# Patient Record
Sex: Female | Born: 1985 | Hispanic: Yes | Marital: Married | Smoking: Never smoker
Health system: Southern US, Community
[De-identification: ages and names within clinical notes are randomized; demographics above are authoritative.]

## PROBLEM LIST (undated history)

## (undated) DIAGNOSIS — I10 Essential (primary) hypertension: Secondary | ICD-10-CM

## (undated) DIAGNOSIS — E119 Type 2 diabetes mellitus without complications: Secondary | ICD-10-CM

## (undated) DIAGNOSIS — K219 Gastro-esophageal reflux disease without esophagitis: Secondary | ICD-10-CM

## (undated) HISTORY — DX: Gastro-esophageal reflux disease without esophagitis: K21.9

## (undated) HISTORY — DX: Essential (primary) hypertension: I10

## (undated) HISTORY — DX: Type 2 diabetes mellitus without complications: E11.9

---

## 2020-03-29 DIAGNOSIS — E669 Obesity, unspecified: Secondary | ICD-10-CM | POA: Insufficient documentation

## 2020-03-29 DIAGNOSIS — E66811 Obesity, class 1: Secondary | ICD-10-CM | POA: Insufficient documentation

## 2020-03-29 DIAGNOSIS — E282 Polycystic ovarian syndrome: Secondary | ICD-10-CM | POA: Insufficient documentation

## 2020-03-29 DIAGNOSIS — Z87410 Personal history of cervical dysplasia: Secondary | ICD-10-CM | POA: Insufficient documentation

## 2020-03-29 DIAGNOSIS — I10 Essential (primary) hypertension: Secondary | ICD-10-CM | POA: Insufficient documentation

## 2020-03-30 DIAGNOSIS — G5602 Carpal tunnel syndrome, left upper limb: Secondary | ICD-10-CM | POA: Insufficient documentation

## 2020-08-15 ENCOUNTER — Other Ambulatory Visit: Payer: Self-pay

## 2020-08-15 ENCOUNTER — Ambulatory Visit
Admission: EM | Admit: 2020-08-15 | Discharge: 2020-08-15 | Disposition: A | Discharge status: Home / Self Care | Payer: 59

## 2020-08-16 LAB — HEPATIC FUNCTION PANEL
ALT: 85 — AB (ref 7–35)
AST: 42 — AB (ref 13–35)
Alkaline Phosphatase: 90 (ref 25–125)
Bilirubin, Total: 0.3

## 2020-08-16 LAB — BASIC METABOLIC PANEL
BUN: 11 (ref 4–21)
CO2: 22 (ref 13–22)
Chloride: 104 (ref 99–108)
Creatinine: 0.7 (ref 0.5–1.1)
Potassium: 4.1 (ref 3.4–5.3)
Sodium: 137 (ref 137–147)

## 2020-10-29 ENCOUNTER — Ambulatory Visit: Payer: 59 | Admitting: Internal Medicine

## 2020-11-15 ENCOUNTER — Telehealth: Payer: Self-pay

## 2020-11-15 ENCOUNTER — Encounter: Payer: Self-pay | Admitting: Internal Medicine

## 2020-11-15 NOTE — Telephone Encounter (Signed)
Copied from CRM [id number]. Topic: General - Other >> Nov 15, 2020 11:06 AM Darron Doom wrote: Reason for CRM: Patient called in to inquire of Dr Judithann Graves that she is out of her BP medication ( Labetalol 400 MG BID ) and need it refilled. Say that since her appointment was cancelled from original she is needing help. States that her former PCP will no longer fill this medication Please advise  Ph# [phone]

## 2020-11-15 NOTE — Telephone Encounter (Signed)
Will route result note to [facility] Nurse Triage for follow up when patient returns call to clinic. Nurse may give results to patient if they return call. CRM created for this message.

## 2020-11-15 NOTE — Telephone Encounter (Signed)
Called pt left VM to call back. We can not send in any medication for pt before she establishes care. Called pharmacy [facility] in [place] they stated that pt picked up lasted refill 10/26/2020 and that she has a refill. Pt has a appt on 11/23/20. Pt should get refill from [facility].  KP

## 2020-11-15 NOTE — Telephone Encounter (Signed)
Spoke to pt let her know that she would need to go to [facility] to get her medication and if she is supposed to be taking 400 MG to take 400 MG and she should have enough for her next appt. Let pt know we can not send in any medication before she has established care. Pt verbalized understanding.  KP

## 2020-11-15 NOTE — Telephone Encounter (Signed)
Per initial encounter, "Called pt left VM to call back. We can not send in any medication for pt before she establishes care. Called pharmacy [facility] in [place] they stated that pt picked up lasted refill 10/26/2020 and that she has a refill. Pt has a appt on 11/23/20. Pt should get refill from [facility]. KP"; the pt verbalized understanding and says she is taking 400 mg of labetalol instead of 200 mg because she has post partum pre-eclampsia; her 10/29/20 appt with Dr  Judithann Graves was cancelled because the provider was going to be out of the office; she says [facility] told her since more than 12 weeks ago (08/08/20) they are not able to give her any medication; the pt can be contacted at [phone]; she is re-scheduled to see Dr Judithann Graves 11/23/20 at 1520; will route to office for final disposition.

## 2020-11-23 ENCOUNTER — Ambulatory Visit: Payer: 59 | Admitting: Internal Medicine

## 2020-11-23 ENCOUNTER — Other Ambulatory Visit: Payer: Self-pay

## 2020-11-23 ENCOUNTER — Encounter: Payer: Self-pay | Admitting: Internal Medicine

## 2020-11-23 VITALS — BP 128/84 | HR 88 | Ht 65.0 in | Wt 193.0 lb

## 2020-11-23 DIAGNOSIS — E118 Type 2 diabetes mellitus with unspecified complications: Secondary | ICD-10-CM | POA: Insufficient documentation

## 2020-11-23 DIAGNOSIS — I10 Essential (primary) hypertension: Secondary | ICD-10-CM | POA: Diagnosis not present

## 2020-11-23 DIAGNOSIS — R0981 Nasal congestion: Secondary | ICD-10-CM

## 2020-11-23 MED ORDER — BENAZEPRIL HCL 40 MG PO TABS: 40.0000 mg | ORAL_TABLET | Freq: Every day | ORAL | 1 refills | Status: DC

## 2020-11-23 MED ORDER — METFORMIN HCL 1000 MG PO TABS: 1000.0000 mg | ORAL_TABLET | Freq: Two times a day (BID) | ORAL | 1 refills | Status: DC

## 2020-11-23 NOTE — Progress Notes (Signed)
Date:  11/23/2020   Name:  Sarah Proctor   DOB:  11/02/1985   MRN:  [id number]   Chief Complaint: Establish Care (Was getting meds from [facility] )  Hypertension This is a chronic problem. Episode onset: age 35. The problem is controlled. Pertinent negatives include no chest pain, headaches, palpitations or shortness of breath. Past treatments include beta blockers and ACE inhibitors (on benazepril 40 mg before her pregnancy.  now taking labetalol 300 mg bid). The current treatment provides moderate improvement. There are no compliance problems (although diet is not the most healthy).  There is no history of kidney disease, CAD/MI or CVA.  Diabetes She presents for her follow-up diabetic visit. She has type 2 diabetes mellitus. The initial diagnosis of diabetes was made 5 years ago. Her disease course has been stable. Pertinent negatives for hypoglycemia include no headaches or nervousness/anxiousness. Pertinent negatives for diabetes include no chest pain, no fatigue, no polydipsia and no polyuria. Symptoms are stable. Pertinent negatives for diabetic complications include no CVA. She is following a generally healthy diet. She monitors blood glucose at home 1-2 x per day. Her breakfast blood glucose is taken between 7-8 am. Her breakfast blood glucose range is generally 110-130 mg/dl. Her bedtime blood glucose is taken between 8-9 pm. Her bedtime blood glucose range is generally 140-180 mg/dl. An ACE inhibitor/angiotensin II receptor blocker is not being taken (due to recent pregnancy).  Sinus Problem This is a chronic problem. The problem is unchanged. There has been no fever. She is experiencing no pain. Associated symptoms include congestion. Pertinent negatives include no coughing, headaches or shortness of breath. (Severe, using Afrin spray to be able to breathe)    Lab Results  Component Value Date   CREATININE 0.7 08/16/2020   BUN 11 08/16/2020   NA 137 08/16/2020   K 4.1  08/16/2020   CL 104 08/16/2020   CO2 22 08/16/2020   No results found for: CHOL, HDL, LDLCALC, LDLDIRECT, TRIG, CHOLHDL No results found for: TSH No results found for: HGBA1C No results found for: WBC, HGB, HCT, MCV, PLT Lab Results  Component Value Date   ALT 85 (A) 08/16/2020   AST 42 (A) 08/16/2020   ALKPHOS 90 08/16/2020     Review of Systems  Constitutional: Negative for appetite change, fatigue, fever and unexpected weight change.  HENT: Positive for congestion.   Eyes: Negative for visual disturbance.  Respiratory: Negative for cough, chest tightness and shortness of breath.   Cardiovascular: Negative for chest pain, palpitations and leg swelling.  Gastrointestinal: Negative for abdominal pain.  Endocrine: Negative for polydipsia and polyuria.  Genitourinary: Negative for dysuria and hematuria.  Musculoskeletal: Negative for arthralgias.  Neurological: Negative for headaches.  Psychiatric/Behavioral: Negative for dysphoric mood. The patient is not nervous/anxious.     Patient Active Problem List   Diagnosis Date Noted  . Type II diabetes mellitus with complication (HCC) 11/23/2020  . Chronic nasal congestion 11/23/2020  . Left carpal tunnel syndrome 03/30/2020  . PCOS (polycystic ovarian syndrome) 03/29/2020  . Obesity (BMI 30.0-34.9) 03/29/2020  . History of cervical dysplasia 03/29/2020  . Essential hypertension 03/29/2020    Not on File  History reviewed. No pertinent surgical history.  Social History   Tobacco Use  . Smoking status: Never Smoker  . Smokeless tobacco: Never Used  Vaping Use  . Vaping Use: Never used  Substance Use Topics  . Alcohol use: Not Currently  . Drug use: Never     Medication  list has been reviewed and updated.  Current Meds  Medication Sig  . BD INSULIN SYRINGE U/F 31G X 5/16" 1 ML MISC See admin instructions.  . benazepril (LOTENSIN) 40 MG tablet Take 1 tablet (40 mg total) by mouth daily.  Marland Kitchen glucose blood ([employer]  BLOOD GLUCOSE TEST) test strip 1 each (1 strip total) by XX route 4 (four) times daily Use as instructed.  . [DISCONTINUED] labetalol (NORMODYNE) 200 MG tablet Take 300 mg by mouth 2 (two) times daily.  . [DISCONTINUED] metFORMIN (GLUCOPHAGE) 1000 MG tablet Take 1,000 mg by mouth 2 (two) times daily.    PHQ 2/9 Scores 11/23/2020  PHQ - 2 Score 0  PHQ- 9 Score 1    GAD 7 : Generalized Anxiety Score 11/23/2020  Nervous, Anxious, on Edge 1  Control/stop worrying 1  Worry too much - different things 1  Trouble relaxing 1  Restless 1  Easily annoyed or irritable 1  Afraid - awful might happen 1  Total GAD 7 Score 7    BP Readings from Last 3 Encounters:  11/23/20 128/84    Physical Exam Vitals and nursing note reviewed.  Constitutional:      General: She is not in acute distress.    Appearance: Normal appearance. She is well-developed.  HENT:     Head: Normocephalic and atraumatic.     Nose:     Right Turbinates: Enlarged, swollen and pale.     Left Turbinates: Enlarged, swollen and pale.     Right Sinus: No maxillary sinus tenderness or frontal sinus tenderness.     Left Sinus: No maxillary sinus tenderness or frontal sinus tenderness.  Neck:     Vascular: No carotid bruit.  Cardiovascular:     Rate and Rhythm: Normal rate and regular rhythm.     Pulses: Normal pulses.     Heart sounds: No murmur heard.   Pulmonary:     Effort: Pulmonary effort is normal. No respiratory distress.     Breath sounds: No wheezing or rhonchi.  Musculoskeletal:        General: Normal range of motion.     Cervical back: Normal range of motion.     Right lower leg: No edema.     Left lower leg: No edema.  Lymphadenopathy:     Cervical: No cervical adenopathy.  Skin:    General: Skin is warm and dry.     Findings: No rash.  Neurological:     General: No focal deficit present.     Mental Status: She is alert and oriented to person, place, and time.  Psychiatric:        Mood and Affect:  Mood and affect and mood normal.        Behavior: Behavior normal.     Wt Readings from Last 3 Encounters:  11/23/20 193 lb (87.5 kg)    BP 128/84   Pulse 88   Ht 5\' 5"  (1.651 m)   Wt 193 lb (87.5 kg)   LMP 10/29/2020   SpO2 98%   BMI 32.12 kg/m   Assessment and Plan: 1. Essential hypertension BP is fairly well controlled on high dose labetaolol Did well in the past on Benazepril so will resume Continue working on diet changes; monitor BP at home and follow up in 3 months, sooner if problems. - benazepril (LOTENSIN) 40 MG tablet; Take 1 tablet (40 mg total) by mouth daily.  Dispense: 90 tablet; Refill: 1 - CBC with Differential/Platelet - TSH  2.  Type II diabetes mellitus with complication (HCC) Clinically stable by exam and report without s/s of hypoglycemia. DM complicated by HTN. Tolerating medications - metformin 1000 mg bid - well without side effects or other concerns. - metFORMIN (GLUCOPHAGE) 1000 MG tablet; Take 1 tablet (1,000 mg total) by mouth 2 (two) times daily.  Dispense: 180 tablet; Refill: 1 - Comprehensive metabolic panel - Hemoglobin A1c  3. Chronic nasal congestion Can use Afrin PRN but avoid daily use Will refer to ENT for further evaluation - Ambulatory referral to ENT   Partially dictated using Dragon software. Any errors are unintentional.  Bari Edward, MD [facility]  11/23/2020

## 2020-11-24 LAB — CBC WITH DIFFERENTIAL/PLATELET
Basophils Absolute: 0.1 10*3/uL (ref 0.0–0.2)
Basos: 1 %
EOS (ABSOLUTE): 0.6 10*3/uL — ABNORMAL HIGH (ref 0.0–0.4)
Eos: 6 %
Hematocrit: 40.8 % (ref 34.0–46.6)
Hemoglobin: 13.3 g/dL (ref 11.1–15.9)
Immature Grans (Abs): 0.1 10*3/uL (ref 0.0–0.1)
Immature Granulocytes: 1 %
Lymphocytes Absolute: 2 10*3/uL (ref 0.7–3.1)
Lymphs: 20 %
MCH: 27.8 pg (ref 26.6–33.0)
MCHC: 32.6 g/dL (ref 31.5–35.7)
MCV: 85 fL (ref 79–97)
Monocytes Absolute: 0.7 10*3/uL (ref 0.1–0.9)
Monocytes: 7 %
Neutrophils Absolute: 6.5 10*3/uL (ref 1.4–7.0)
Neutrophils: 65 %
Platelets: 227 10*3/uL (ref 150–450)
RBC: 4.78 x10E6/uL (ref 3.77–5.28)
RDW: 13.7 % (ref 11.7–15.4)
WBC: 9.9 10*3/uL (ref 3.4–10.8)

## 2020-11-24 LAB — COMPREHENSIVE METABOLIC PANEL
ALT: 38 IU/L — ABNORMAL HIGH (ref 0–32)
AST: 19 IU/L (ref 0–40)
Albumin/Globulin Ratio: 1.8 (ref 1.2–2.2)
Albumin: 4.6 g/dL (ref 3.8–4.8)
Alkaline Phosphatase: 77 IU/L (ref 44–121)
BUN/Creatinine Ratio: 19 (ref 9–23)
BUN: 13 mg/dL (ref 6–20)
Bilirubin Total: 0.2 mg/dL (ref 0.0–1.2)
CO2: 20 mmol/L (ref 20–29)
Calcium: 9.6 mg/dL (ref 8.7–10.2)
Chloride: 101 mmol/L (ref 96–106)
Creatinine, Ser: 0.68 mg/dL (ref 0.57–1.00)
GFR calc Af Amer: 131 mL/min/{1.73_m2} (ref >59)
GFR calc non Af Amer: 114 mL/min/{1.73_m2} (ref >59)
Globulin, Total: 2.5 g/dL (ref 1.5–4.5)
Glucose: 108 mg/dL — ABNORMAL HIGH (ref 65–99)
Potassium: 4.2 mmol/L (ref 3.5–5.2)
Sodium: 138 mmol/L (ref 134–144)
Total Protein: 7.1 g/dL (ref 6.0–8.5)

## 2020-11-24 LAB — HEMOGLOBIN A1C
Est. average glucose Bld gHb Est-mCnc: 143 mg/dL
Hgb A1c MFr Bld: 6.6 % — ABNORMAL HIGH (ref 4.8–5.6)

## 2020-11-24 LAB — TSH: TSH: 1.42 u[IU]/mL (ref 0.450–4.500)

## 2020-12-07 ENCOUNTER — Telehealth: Payer: Self-pay | Admitting: Internal Medicine

## 2020-12-07 ENCOUNTER — Other Ambulatory Visit: Payer: Self-pay | Admitting: Internal Medicine

## 2020-12-07 DIAGNOSIS — I10 Essential (primary) hypertension: Secondary | ICD-10-CM

## 2020-12-07 MED ORDER — LABETALOL HCL 200 MG PO TABS: 300.0000 mg | ORAL_TABLET | Freq: Two times a day (BID) | ORAL | 2 refills | Status: DC

## 2020-12-07 NOTE — Telephone Encounter (Signed)
Please review pt had a new patient appt on 11/23/2020.  KP

## 2020-12-07 NOTE — Telephone Encounter (Signed)
Labetolol sent in.  She will need to follow up closely with her OB for HTN.

## 2020-12-07 NOTE — Telephone Encounter (Signed)
Pt called stating that she recently found out that she was pregnant and that she is needing to have a new BP medication. Please advise.     [id number] [place], [place] - [address] AT [facility] OF [address]  [address] [place] [address]  Phone: [phone] Fax: [phone]  Hours: Not open 24 hours

## 2020-12-07 NOTE — Telephone Encounter (Signed)
Spoke to pt let her know that labetolol was sent to pharmacy. Let pt know that she needed to follow up closely with OB for her HTN. Pt verbalized understanding.  KP

## 2021-01-18 LAB — HM PAP SMEAR: HM Pap smear: NEGATIVE

## 2021-02-21 ENCOUNTER — Ambulatory Visit: Payer: 59 | Admitting: Internal Medicine

## 2021-02-21 ENCOUNTER — Telehealth: Payer: Self-pay

## 2021-02-21 NOTE — Progress Notes (Deleted)
    Date:  02/21/2021   Name:  Sarah Proctor   DOB:  [birth year]   MRN:  [id number]   Chief Complaint: No chief complaint on file.  Diabetes  Hypertension    Lab Results  Component Value Date   CREATININE 0.68 11/23/2020   BUN 13 11/23/2020   NA 138 11/23/2020   K 4.2 11/23/2020   CL 101 11/23/2020   CO2 20 11/23/2020   No results found for: CHOL, HDL, LDLCALC, LDLDIRECT, TRIG, CHOLHDL Lab Results  Component Value Date   TSH 1.420 11/23/2020   Lab Results  Component Value Date   HGBA1C 6.6 (H) 11/23/2020   Lab Results  Component Value Date   WBC 9.9 11/23/2020   HGB 13.3 11/23/2020   HCT 40.8 11/23/2020   MCV 85 11/23/2020   PLT 227 11/23/2020   Lab Results  Component Value Date   ALT 38 (H) 11/23/2020   AST 19 11/23/2020   ALKPHOS 77 11/23/2020   BILITOT 0.2 11/23/2020     Review of Systems  Patient Active Problem List   Diagnosis Date Noted  . Type II diabetes mellitus with complication (HCC) 11/23/2020  . Chronic nasal congestion 11/23/2020  . Left carpal tunnel syndrome 03/30/2020  . PCOS (polycystic ovarian syndrome) 03/29/2020  . Obesity (BMI 30.0-34.9) 03/29/2020  . History of cervical dysplasia 03/29/2020  . Essential hypertension 03/29/2020    Not on File  No past surgical history on file.  Social History   Tobacco Use  . Smoking status: Never Smoker  . Smokeless tobacco: Never Used  Vaping Use  . Vaping Use: Never used  Substance Use Topics  . Alcohol use: Not Currently  . Drug use: Never     Medication list has been reviewed and updated.  No outpatient medications have been marked as taking for the 02/21/21 encounter (Appointment) with Reubin Milan, MD.    [facility] 2/9 Scores 11/23/2020  PHQ - 2 Score 0  PHQ- 9 Score 1    GAD 7 : Generalized Anxiety Score 11/23/2020  Nervous, Anxious, on Edge 1  Control/stop worrying 1  Worry too much - different things 1  Trouble relaxing 1  Restless 1  Easily annoyed or irritable  1  Afraid - awful might happen 1  Total GAD 7 Score 7    BP Readings from Last 3 Encounters:  11/23/20 128/84    Physical Exam  Wt Readings from Last 3 Encounters:  11/23/20 193 lb (87.5 kg)    There were no vitals taken for this visit.  Assessment and Plan:

## 2021-02-21 NOTE — Telephone Encounter (Signed)
Tried calling patient about appt today for Dr. Judithann Graves. Patient did not answer so left a VM - Informed she does not need to come to her visit today. Told her to continue to follow up with her GYN for her pregnancy, HTN, and diabetes.  Told her to call back with any further questions.

## 2021-08-28 ENCOUNTER — Ambulatory Visit: Payer: 59 | Admitting: Internal Medicine

## 2021-08-30 ENCOUNTER — Ambulatory Visit
Admission: RE | Admit: 2021-08-30 | Discharge: 2021-08-30 | Disposition: A | Discharge status: Home / Self Care | Payer: 59 | Attending: Internal Medicine | Admitting: Internal Medicine

## 2021-08-30 ENCOUNTER — Ambulatory Visit
Admission: RE | Admit: 2021-08-30 | Discharge: 2021-08-30 | Disposition: A | Discharge status: Home / Self Care | Payer: 59 | Source: Ambulatory Visit | Attending: Internal Medicine | Admitting: Internal Medicine

## 2021-08-30 ENCOUNTER — Encounter: Payer: Self-pay | Admitting: Internal Medicine

## 2021-08-30 ENCOUNTER — Ambulatory Visit: Payer: 59 | Admitting: Internal Medicine

## 2021-08-30 ENCOUNTER — Other Ambulatory Visit: Payer: Self-pay

## 2021-08-30 VITALS — BP 140/98 | HR 77 | Ht 65.0 in | Wt 206.0 lb

## 2021-08-30 DIAGNOSIS — M25571 Pain in right ankle and joints of right foot: Secondary | ICD-10-CM | POA: Diagnosis present

## 2021-08-30 DIAGNOSIS — M67441 Ganglion, right hand: Secondary | ICD-10-CM | POA: Diagnosis not present

## 2021-08-30 DIAGNOSIS — G8929 Other chronic pain: Secondary | ICD-10-CM

## 2021-08-30 DIAGNOSIS — E118 Type 2 diabetes mellitus with unspecified complications: Secondary | ICD-10-CM | POA: Diagnosis not present

## 2021-08-30 DIAGNOSIS — I10 Essential (primary) hypertension: Secondary | ICD-10-CM

## 2021-08-30 MED ORDER — METFORMIN HCL 1000 MG PO TABS: 1000.0000 mg | ORAL_TABLET | Freq: Two times a day (BID) | ORAL | 0 refills | Status: DC

## 2021-08-30 MED ORDER — LABETALOL HCL 300 MG PO TABS: 300.0000 mg | ORAL_TABLET | Freq: Two times a day (BID) | ORAL | 0 refills | Status: DC | PRN

## 2021-08-30 MED ORDER — BENAZEPRIL HCL 40 MG PO TABS: 40.0000 mg | ORAL_TABLET | Freq: Every day | ORAL | 1 refills | Status: DC

## 2021-08-30 NOTE — Progress Notes (Signed)
Date:  08/30/2021   Name:  Sarah Proctor   DOB:  July 21, 1986   MRN:  [id number]   Chief Complaint: Hypertension  Hypertension This is a chronic problem. The problem is controlled. Pertinent negatives include no chest pain, headaches, palpitations or shortness of breath. Past treatments include beta blockers (but ready to change to a once daily med for better control). There is no history of kidney disease, CAD/MI or CVA.  Diabetes She presents for her follow-up diabetic visit. She has type 2 diabetes mellitus. Her disease course has been stable. Pertinent negatives for hypoglycemia include no dizziness or headaches. Pertinent negatives for diabetes include no chest pain, no fatigue and no weakness. Pertinent negatives for diabetic complications include no CVA. Current diabetic treatment includes oral agent (monotherapy). She is compliant with treatment all of the time.  Ankle Pain  There was no injury mechanism. The pain is present in the right ankle. The quality of the pain is described as aching. The pain is moderate. The pain has been Worsening since onset. Associated symptoms include a loss of motion. Pertinent negatives include no loss of sensation, numbness or tingling. The symptoms are aggravated by weight bearing and movement.  Hand Pain  There was no injury mechanism. The pain is present in the right hand (two masses on the dorsum that limit flexion). The pain has been Worsening since the incident. Pertinent negatives include no chest pain, numbness or tingling.   Lab Results  Component Value Date   CREATININE 0.68 11/23/2020   BUN 13 11/23/2020   NA 138 11/23/2020   K 4.2 11/23/2020   CL 101 11/23/2020   CO2 20 11/23/2020   No results found for: CHOL, HDL, LDLCALC, LDLDIRECT, TRIG, CHOLHDL Lab Results  Component Value Date   TSH 1.420 11/23/2020   Lab Results  Component Value Date   HGBA1C 6.6 (H) 11/23/2020   Lab Results  Component Value Date   WBC 9.9 11/23/2020    HGB 13.3 11/23/2020   HCT 40.8 11/23/2020   MCV 85 11/23/2020   PLT 227 11/23/2020   Lab Results  Component Value Date   ALT 38 (H) 11/23/2020   AST 19 11/23/2020   ALKPHOS 77 11/23/2020   BILITOT 0.2 11/23/2020   No components found for: VITD  Review of Systems  Constitutional:  Negative for chills, fatigue and unexpected weight change.  HENT:  Negative for nosebleeds.   Eyes:  Negative for visual disturbance.  Respiratory:  Negative for cough, chest tightness, shortness of breath and wheezing.   Cardiovascular:  Negative for chest pain, palpitations and leg swelling.  Gastrointestinal:  Negative for abdominal pain, constipation and diarrhea.  Musculoskeletal:  Positive for arthralgias, gait problem and joint swelling.  Neurological:  Negative for dizziness, tingling, weakness, light-headedness, numbness and headaches.   Patient Active Problem List   Diagnosis Date Noted   Type II diabetes mellitus with complication (HCC) 11/23/2020   Chronic nasal congestion 11/23/2020   Left carpal tunnel syndrome 03/30/2020   PCOS (polycystic ovarian syndrome) 03/29/2020   Obesity (BMI 30.0-34.9) 03/29/2020   History of cervical dysplasia 03/29/2020   Essential hypertension 03/29/2020    Not on File  History reviewed. No pertinent surgical history.  Social History   Tobacco Use   Smoking status: Never   Smokeless tobacco: Never  Vaping Use   Vaping Use: Never used  Substance Use Topics   Alcohol use: Not Currently   Drug use: Never     Medication list has  been reviewed and updated.  Current Meds  Medication Sig   BD INSULIN SYRINGE U/F 31G X 5/16" 1 ML MISC See admin instructions.   famotidine (PEPCID) 20 MG tablet Take 20 mg by mouth 2 (two) times daily.   glucose blood ([employer] BLOOD GLUCOSE TEST) test strip 1 each (1 strip total) by XX route 4 (four) times daily Use as instructed.   labetalol (NORMODYNE) 300 MG tablet Take 300 mg by mouth 3 (three) times daily.    metFORMIN (GLUCOPHAGE) 1000 MG tablet Take 1 tablet by mouth 2 (two) times daily.    PHQ 2/9 Scores 08/30/2021 11/23/2020  PHQ - 2 Score 0 0  PHQ- 9 Score 2 1    GAD 7 : Generalized Anxiety Score 08/30/2021 11/23/2020  Nervous, Anxious, on Edge 1 1  Control/stop worrying 1 1  Worry too much - different things 1 1  Trouble relaxing 1 1  Restless 1 1  Easily annoyed or irritable 1 1  Afraid - awful might happen 1 1  Total GAD 7 Score 7 7  Anxiety Difficulty Not difficult at all -    BP Readings from Last 3 Encounters:  08/30/21 (!) 140/98  11/23/20 128/84    Physical Exam Vitals and nursing note reviewed.  Constitutional:      General: She is not in acute distress.    Appearance: Normal appearance. She is well-developed.  HENT:     Head: Normocephalic and atraumatic.  Cardiovascular:     Rate and Rhythm: Normal rate and regular rhythm.     Pulses: Normal pulses.  Pulmonary:     Effort: Pulmonary effort is normal. No respiratory distress.     Breath sounds: No wheezing or rhonchi.  Musculoskeletal:     Right hand: Decreased range of motion (and 2 soft mobile masses on dorsum).     Cervical back: Normal range of motion.     Right lower leg: No edema.     Left lower leg: No edema.     Right ankle: Swelling (soft tissues and bony enlargement laterally) present. Tenderness present. Decreased range of motion. Normal pulse.  Lymphadenopathy:     Cervical: No cervical adenopathy.  Skin:    General: Skin is warm and dry.     Findings: No rash.  Neurological:     Mental Status: She is alert and oriented to person, place, and time.  Psychiatric:        Mood and Affect: Mood normal.        Behavior: Behavior normal.    Wt Readings from Last 3 Encounters:  08/30/21 206 lb (93.4 kg)  11/23/20 193 lb (87.5 kg)    BP (!) 140/98   Pulse 77   Ht 5\' 5"  (1.651 m)   Wt 206 lb (93.4 kg)   SpO2 99%   BMI 34.28 kg/m   Assessment and Plan: 1. Essential hypertension Wean off  of labetalol and begin benazepril Can take labetalol bid as needed for several weeks while Benazepril initiation - benazepril (LOTENSIN) 40 MG tablet; Take 1 tablet (40 mg total) by mouth daily.  Dispense: 90 tablet; Refill: 1 - labetalol (NORMODYNE) 300 MG tablet; Take 1 tablet (300 mg total) by mouth 2 (two) times daily as needed.  Dispense: 60 tablet; Refill: 0  2. Type II diabetes mellitus with complication (HCC) Clinically stable by exam and report without s/s of hypoglycemia. DM complicated by hypertension and dyslipidemia. Tolerating medications well without side effects or other concerns. Will get labs  next visit - metFORMIN (GLUCOPHAGE) 1000 MG tablet; Take 1 tablet (1,000 mg total) by mouth 2 (two) times daily.  Dispense: 180 tablet; Refill: 0  3. Ganglion, right hand Can refer to Ortho hand specialist if desired  4. Chronic pain of right ankle No hx of injury; will get imaging to help guide referral process - DG Ankle Complete Right   Partially dictated using Dragon software. Any errors are unintentional.  Halina Maidens, MD [facility]  08/30/2021

## 2021-09-02 ENCOUNTER — Other Ambulatory Visit: Payer: Self-pay

## 2021-09-02 DIAGNOSIS — G8929 Other chronic pain: Secondary | ICD-10-CM

## 2021-09-02 DIAGNOSIS — M25571 Pain in right ankle and joints of right foot: Secondary | ICD-10-CM

## 2021-10-30 ENCOUNTER — Encounter: Payer: 59 | Admitting: Internal Medicine

## 2021-10-30 ENCOUNTER — Telehealth: Payer: Self-pay

## 2021-10-30 NOTE — Telephone Encounter (Signed)
Left voicemail to reschedule missed appointment.

## 2021-10-30 NOTE — Progress Notes (Deleted)
° ° °  Date:  10/30/2021   Name:  Sarah Proctor   DOB:  November 19, 1985   MRN:  [id number]   Chief Complaint: No chief complaint on file.  Hypertension This is a chronic problem. The problem is controlled. Past treatments include ACE inhibitors and beta blockers. The current treatment provides significant improvement. There is no history of kidney disease, CAD/MI or CVA.  Diabetes She presents for her follow-up diabetic visit. She has type 2 diabetes mellitus. Her disease course has been stable. Pertinent negatives for diabetic complications include no CVA. Current diabetic treatment includes oral agent (monotherapy) (metformin). She is compliant with treatment all of the time.   Lab Results  Component Value Date   NA 138 11/23/2020   K 4.2 11/23/2020   CO2 20 11/23/2020   GLUCOSE 108 (H) 11/23/2020   BUN 13 11/23/2020   CREATININE 0.68 11/23/2020   CALCIUM 9.6 11/23/2020   GFRNONAA 114 11/23/2020   No results found for: CHOL, HDL, LDLCALC, LDLDIRECT, TRIG, CHOLHDL Lab Results  Component Value Date   TSH 1.420 11/23/2020   Lab Results  Component Value Date   HGBA1C 6.6 (H) 11/23/2020   Lab Results  Component Value Date   WBC 9.9 11/23/2020   HGB 13.3 11/23/2020   HCT 40.8 11/23/2020   MCV 85 11/23/2020   PLT 227 11/23/2020   Lab Results  Component Value Date   ALT 38 (H) 11/23/2020   AST 19 11/23/2020   ALKPHOS 77 11/23/2020   BILITOT 0.2 11/23/2020   No results found for: Janith Lima, VD25OH   Review of Systems  Patient Active Problem List   Diagnosis Date Noted   Chronic pain of right ankle 08/30/2021   Type II diabetes mellitus with complication (HCC) 11/23/2020   Chronic nasal congestion 11/23/2020   Left carpal tunnel syndrome 03/30/2020   PCOS (polycystic ovarian syndrome) 03/29/2020   Obesity (BMI 30.0-34.9) 03/29/2020   History of cervical dysplasia 03/29/2020   Essential hypertension 03/29/2020    Not on File  No past surgical history on  file.  Social History   Tobacco Use   Smoking status: Never   Smokeless tobacco: Never  Vaping Use   Vaping Use: Never used  Substance Use Topics   Alcohol use: Not Currently   Drug use: Never     Medication list has been reviewed and updated.  No outpatient medications have been marked as taking for the 10/30/21 encounter (Appointment) with Reubin Milan, MD.    [facility] 2/9 Scores 08/30/2021 11/23/2020  PHQ - 2 Score 0 0  PHQ- 9 Score 2 1    GAD 7 : Generalized Anxiety Score 08/30/2021 11/23/2020  Nervous, Anxious, on Edge 1 1  Control/stop worrying 1 1  Worry too much - different things 1 1  Trouble relaxing 1 1  Restless 1 1  Easily annoyed or irritable 1 1  Afraid - awful might happen 1 1  Total GAD 7 Score 7 7  Anxiety Difficulty Not difficult at all -    BP Readings from Last 3 Encounters:  08/30/21 (!) 140/98  11/23/20 128/84    Physical Exam  Wt Readings from Last 3 Encounters:  08/30/21 206 lb (93.4 kg)  11/23/20 193 lb (87.5 kg)    There were no vitals taken for this visit.  Assessment and Plan:

## 2021-10-31 NOTE — Progress Notes (Signed)
Error

## 2021-12-13 ENCOUNTER — Encounter: Payer: Self-pay | Admitting: Internal Medicine

## 2021-12-13 ENCOUNTER — Other Ambulatory Visit: Payer: Self-pay

## 2021-12-13 ENCOUNTER — Ambulatory Visit: Payer: 59 | Admitting: Internal Medicine

## 2021-12-13 VITALS — BP 124/72 | HR 64 | Ht 65.0 in | Wt 201.0 lb

## 2021-12-13 DIAGNOSIS — I10 Essential (primary) hypertension: Secondary | ICD-10-CM | POA: Diagnosis not present

## 2021-12-13 DIAGNOSIS — E118 Type 2 diabetes mellitus with unspecified complications: Secondary | ICD-10-CM

## 2021-12-13 DIAGNOSIS — Z1159 Encounter for screening for other viral diseases: Secondary | ICD-10-CM | POA: Diagnosis not present

## 2021-12-13 MED ORDER — BENAZEPRIL HCL 40 MG PO TABS: 40.0000 mg | ORAL_TABLET | Freq: Every day | ORAL | 1 refills | Status: AC

## 2021-12-13 MED ORDER — METFORMIN HCL 1000 MG PO TABS: 1000.0000 mg | ORAL_TABLET | Freq: Two times a day (BID) | ORAL | 1 refills | Status: AC

## 2021-12-13 NOTE — Progress Notes (Signed)
? ? ?Date:  12/13/2021  ? ?Name:  Sarah Proctor   DOB:  [birth year]   MRN:  [id number] ? ? ?Chief Complaint: Hypertension and Diabetes ? ?Hypertension ?This is a chronic problem. The problem is controlled. Pertinent negatives include no chest pain, headaches, palpitations or shortness of breath. Past treatments include ACE inhibitors. There is no history of kidney disease, CAD/MI or CVA.  ?Diabetes ?She presents for her follow-up diabetic visit. She has type 2 diabetes mellitus. Pertinent negatives for hypoglycemia include no dizziness, headaches, nervousness/anxiousness or speech difficulty. Pertinent negatives for diabetes include no chest pain, no fatigue and no weakness. Pertinent negatives for diabetic complications include no CVA. Current diabetic treatment includes oral agent (monotherapy) (metformin). She is following a generally healthy diet. Her breakfast blood glucose is taken between 6-7 am. Her breakfast blood glucose range is generally 130-140 mg/dl. An ACE inhibitor/angiotensin II receptor blocker is being taken. Eye exam is not current.  ? ?Lab Results  ?Component Value Date  ? NA 138 11/23/2020  ? K 4.2 11/23/2020  ? CO2 20 11/23/2020  ? GLUCOSE 108 (H) 11/23/2020  ? BUN 13 11/23/2020  ? CREATININE 0.68 11/23/2020  ? CALCIUM 9.6 11/23/2020  ? GFRNONAA 114 11/23/2020  ? ?No results found for: CHOL, HDL, LDLCALC, LDLDIRECT, TRIG, CHOLHDL ?Lab Results  ?Component Value Date  ? TSH 1.420 11/23/2020  ? ?Lab Results  ?Component Value Date  ? HGBA1C 6.6 (H) 11/23/2020  ? ?Lab Results  ?Component Value Date  ? WBC 9.9 11/23/2020  ? HGB 13.3 11/23/2020  ? HCT 40.8 11/23/2020  ? MCV 85 11/23/2020  ? PLT 227 11/23/2020  ? ?Lab Results  ?Component Value Date  ? ALT 38 (H) 11/23/2020  ? AST 19 11/23/2020  ? ALKPHOS 77 11/23/2020  ? BILITOT 0.2 11/23/2020  ? ?No results found for: 25OHVITD2, [place], VD25OH  ? ?Review of Systems  ?Constitutional:  Negative for chills, fatigue and unexpected weight change.  ?HENT:   Negative for nosebleeds.   ?Eyes:  Negative for visual disturbance.  ?Respiratory:  Negative for cough, chest tightness, shortness of breath and wheezing.   ?Cardiovascular:  Negative for chest pain, palpitations and leg swelling.  ?Gastrointestinal:  Negative for abdominal pain, constipation and diarrhea.  ?Musculoskeletal:  Negative for arthralgias.  ?Neurological:  Negative for dizziness, speech difficulty, weakness, light-headedness and headaches.  ?Psychiatric/Behavioral:  Negative for dysphoric mood and sleep disturbance. The patient is not nervous/anxious.   ? ?Patient Active Problem List  ? Diagnosis Date Noted  ? Chronic pain of right ankle 08/30/2021  ? Type II diabetes mellitus with complication ([place]) [id number]  ? Chronic nasal congestion 11/23/2020  ? Left carpal tunnel syndrome 03/30/2020  ? PCOS (polycystic ovarian syndrome) 03/29/2020  ? Obesity (BMI 30.0-34.9) 03/29/2020  ? History of cervical dysplasia 03/29/2020  ? Essential hypertension 03/29/2020  ? ? ?No Known Allergies ? ?History reviewed. No pertinent surgical history. ? ?Social History  ? ?Tobacco Use  ? Smoking status: Never  ? Smokeless tobacco: Never  ?Vaping Use  ? Vaping Use: Never used  ?Substance Use Topics  ? Alcohol use: Not Currently  ? Drug use: Never  ? ? ? ?Medication list has been reviewed and updated. ? ?Current Meds  ?Medication Sig  ? benazepril (LOTENSIN) 40 MG tablet Take 1 tablet (40 mg total) by mouth daily.  ? FLUoxetine (PROZAC) 10 MG capsule Take 10 mg by mouth every morning.  ? glucose blood ([employer] BLOOD GLUCOSE TEST) test strip 1 each (1 strip  total) by XX route 4 (four) times daily Use as instructed.  ? metFORMIN (GLUCOPHAGE) 1000 MG tablet Take 1 tablet (1,000 mg total) by mouth 2 (two) times daily.  ? ? ?PHQ 2/9 Scores 12/13/2021 08/30/2021 11/23/2020  ?PHQ - 2 Score 0 0 0  ?PHQ- 9 Score 0 2 1  ? ? ?GAD 7 : Generalized Anxiety Score 12/13/2021 08/30/2021 11/23/2020  ?Nervous, Anxious, on Edge 2 1 1   ?Control/stop  worrying 1 1 1   ?Worry too much - different things 1 1 1   ?Trouble relaxing 2 1 1   ?Restless 2 1 1   ?Easily annoyed or irritable 2 1 1   ?Afraid - awful might happen 2 1 1   ?Total GAD 7 Score 12 7 7   ?Anxiety Difficulty Somewhat difficult Not difficult at all -  ? ? ?BP Readings from Last 3 Encounters:  ?12/13/21 124/72  ?08/30/21 (!) 140/98  ?11/23/20 128/84  ? ? ?Physical Exam ?Vitals and nursing note reviewed.  ?Constitutional:   ?   General: She is not in acute distress. ?   Appearance: She is well-developed.  ?HENT:  ?   Head: Normocephalic and atraumatic.  ?   Nose: Congestion present.  ?Cardiovascular:  ?   Rate and Rhythm: Normal rate and regular rhythm.  ?   Pulses: Normal pulses.  ?   Heart sounds: No murmur heard. ?Pulmonary:  ?   Effort: Pulmonary effort is normal. No respiratory distress.  ?   Breath sounds: No wheezing or rhonchi.  ?Musculoskeletal:  ?   Right lower leg: No edema.  ?   Left lower leg: No edema.  ?Skin: ?   General: Skin is warm and dry.  ?   Capillary Refill: Capillary refill takes less than 2 seconds.  ?   Findings: No rash.  ?Neurological:  ?   General: No focal deficit present.  ?   Mental Status: She is alert and oriented to person, place, and time.  ?Psychiatric:     ?   Mood and Affect: Mood normal.     ?   Behavior: Behavior normal.  ? ? ?Wt Readings from Last 3 Encounters:  ?12/13/21 201 lb (91.2 kg)  ?08/30/21 206 lb (93.4 kg)  ?11/23/20 193 lb (87.5 kg)  ? ? ?BP 124/72   Pulse 64   Ht 5\' 5"  (1.651 m)   Wt 201 lb (91.2 kg)   SpO2 97%   BMI 33.45 kg/m?  ? ?Assessment and Plan: ?1. Essential hypertension ?Clinically stable exam with well controlled BP on Lotensin 40 mg.  ?Tolerating medications without side effects at this time. ?Pt to continue current regimen and low sodium diet; benefits of regular exercise as able discussed. ?- Comprehensive metabolic panel ?- benazepril (LOTENSIN) 40 MG tablet; Take 1 tablet (40 mg total) by mouth daily.  Dispense: 90 tablet; Refill:  1 ? ?2. Type II diabetes mellitus with complication ([facility]) ?Clinically stable by exam and report without s/s of hypoglycemia. ?DM complicated by hypertension and dyslipidemia. ?Tolerating medications well without side effects or other concerns. ?Has not been following diet well recently but plans to get back on track ?Reminded to schedule Eye exam ?- Hemoglobin A1c ?- Microalbumin / creatinine urine ratio ?- metFORMIN (GLUCOPHAGE) 1000 MG tablet; Take 1 tablet (1,000 mg total) by mouth 2 (two) times daily.  Dispense: 180 tablet; Refill: 1 ? ?3. Need for hepatitis C screening test ?- Hepatitis C antibody ? ? ?Partially dictated using Editor, commissioning. Any errors are unintentional. ? ?Halina Maidens,  MD ?[facility] ?[facility] ? ?12/13/2021 ? ? ? ? ?

## 2021-12-15 LAB — COMPREHENSIVE METABOLIC PANEL
ALT: 37 IU/L — ABNORMAL HIGH (ref 0–32)
AST: 17 IU/L (ref 0–40)
Albumin/Globulin Ratio: 2.2 (ref 1.2–2.2)
Albumin: 4.2 g/dL (ref 3.8–4.8)
Alkaline Phosphatase: 73 IU/L (ref 44–121)
BUN/Creatinine Ratio: 20 (ref 9–23)
BUN: 11 mg/dL (ref 6–20)
Bilirubin Total: <0.2 mg/dL (ref 0.0–1.2)
CO2: 20 mmol/L (ref 20–29)
Calcium: 8.6 mg/dL — ABNORMAL LOW (ref 8.7–10.2)
Chloride: 106 mmol/L (ref 96–106)
Creatinine, Ser: 0.55 mg/dL — ABNORMAL LOW (ref 0.57–1.00)
Globulin, Total: 1.9 g/dL (ref 1.5–4.5)
Glucose: 191 mg/dL — ABNORMAL HIGH (ref 70–99)
Potassium: 4 mmol/L (ref 3.5–5.2)
Sodium: 139 mmol/L (ref 134–144)
Total Protein: 6.1 g/dL (ref 6.0–8.5)
eGFR: 122 mL/min/{1.73_m2} (ref >59)

## 2021-12-15 LAB — HEMOGLOBIN A1C
Est. average glucose Bld gHb Est-mCnc: 163 mg/dL
Hgb A1c MFr Bld: 7.3 % — ABNORMAL HIGH (ref 4.8–5.6)

## 2021-12-15 LAB — MICROALBUMIN / CREATININE URINE RATIO
Creatinine, Urine: 77 mg/dL
Microalb/Creat Ratio: 5 mg/g creat (ref 0–29)
Microalbumin, Urine: 3.5 ug/mL

## 2021-12-15 LAB — HEPATITIS C ANTIBODY: Hep C Virus Ab: NONREACTIVE

## 2021-12-17 ENCOUNTER — Telehealth: Payer: Self-pay | Admitting: Internal Medicine

## 2021-12-17 NOTE — Telephone Encounter (Signed)
Pt called to let the dr know she does not want to add another medication at this time. ?Pt would like to improve her numbers by diet and exercise on her own. ?At July appt, pt would like to have this rechecked and go from there  ?

## 2022-04-16 ENCOUNTER — Encounter: Payer: 59 | Admitting: Internal Medicine

## 2022-04-16 NOTE — Progress Notes (Unsigned)
Error. No show.

## 2022-06-25 ENCOUNTER — Telehealth: Payer: Self-pay

## 2022-06-25 NOTE — Telephone Encounter (Signed)
Patient called, no answer, voicemail box not set up. If patient returns call, will need OV scheduled for DM follow up.

## 2022-06-25 NOTE — Telephone Encounter (Signed)
Called pt left VM to call back. Pt needs and appt for DM follow up.  KP

## 2022-06-26 NOTE — Telephone Encounter (Signed)
Called, unable to leave VM. No VM set up.

## 2022-12-17 IMAGING — CR DG ANKLE COMPLETE 3+V*R*
3 series · 3 of 3 positions shown · non-contrast
Comparison: None.

CLINICAL DATA: Right ankle pain and swelling 7 years. Pain worse
recently. No injury.

EXAM:
RIGHT ANKLE - COMPLETE 3+ VIEW

[ankle ap]
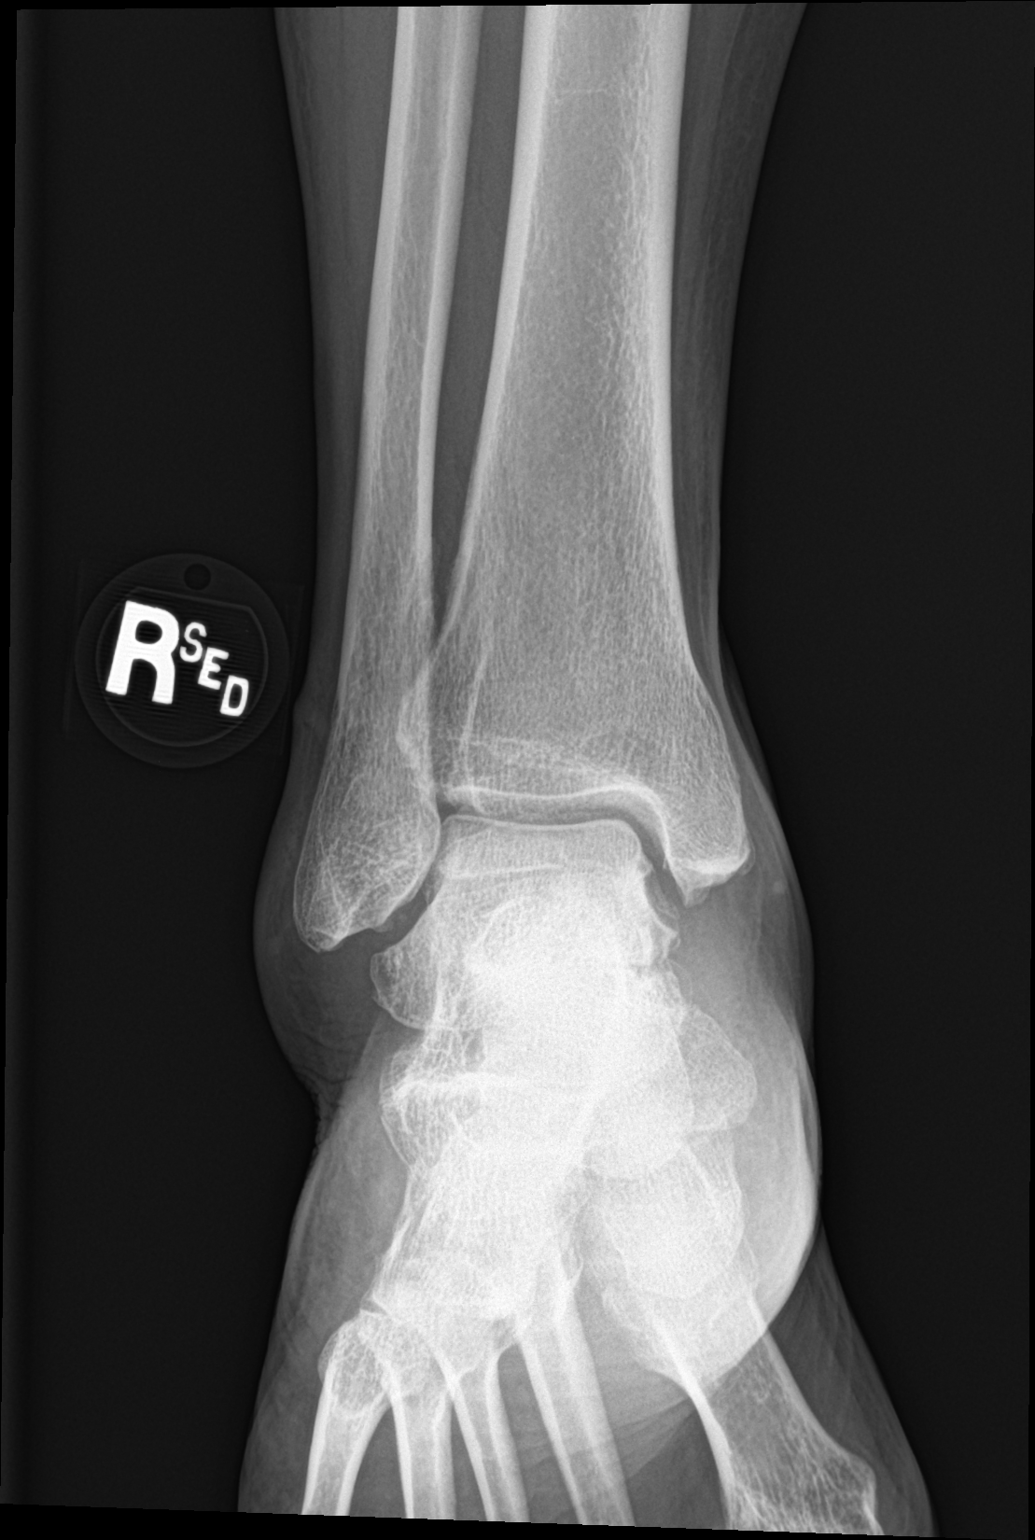

[ankle obl]
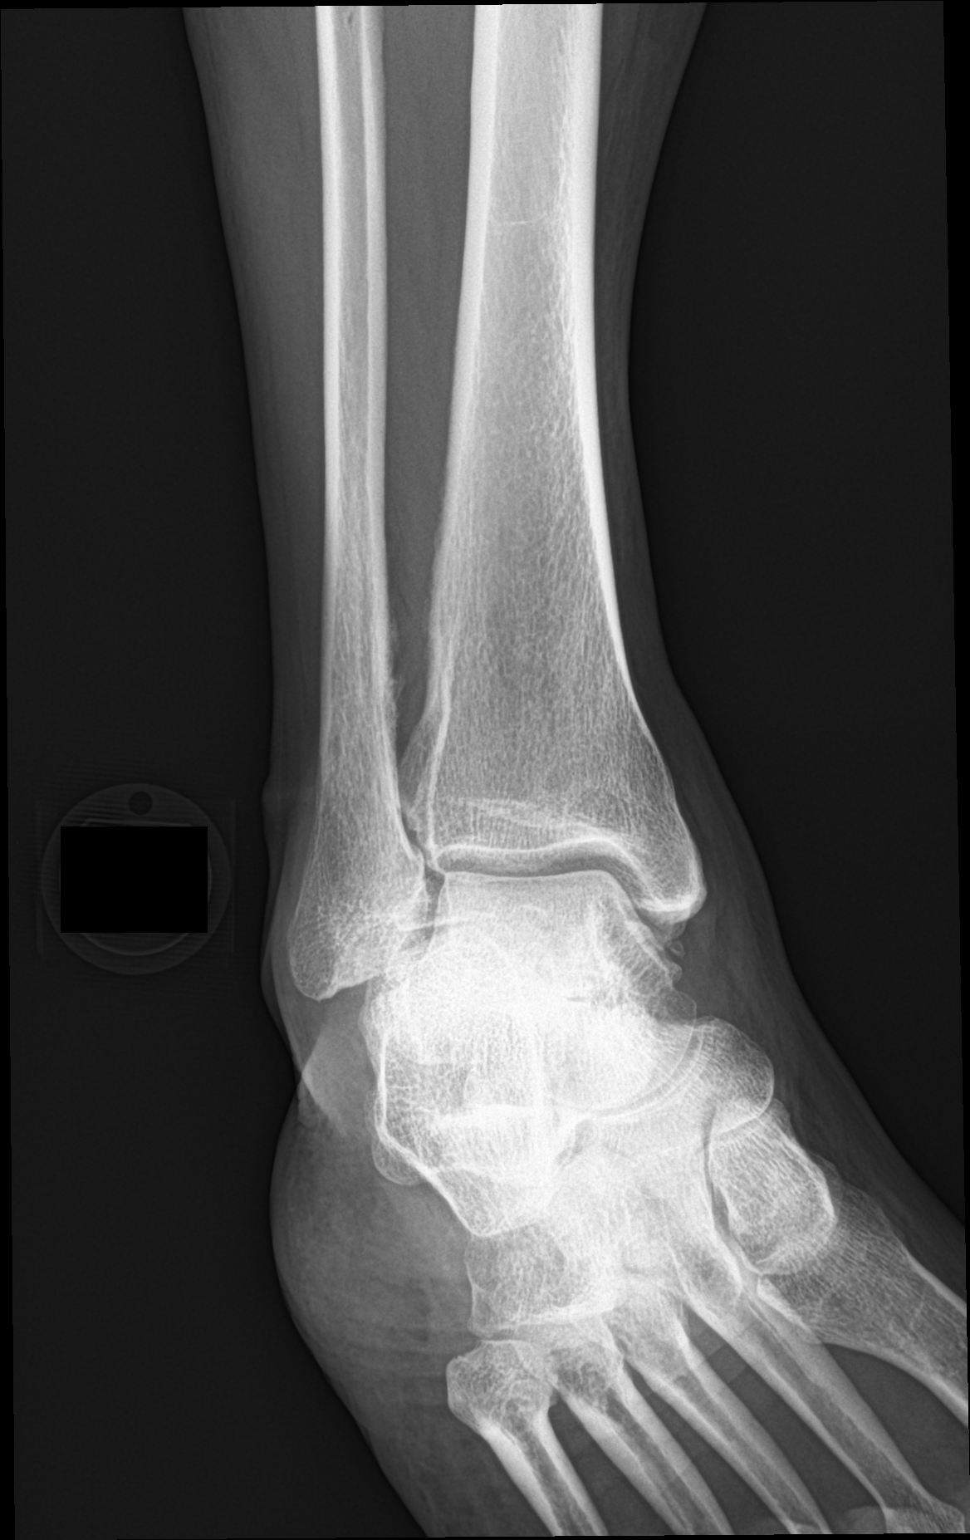

[ankle lat]
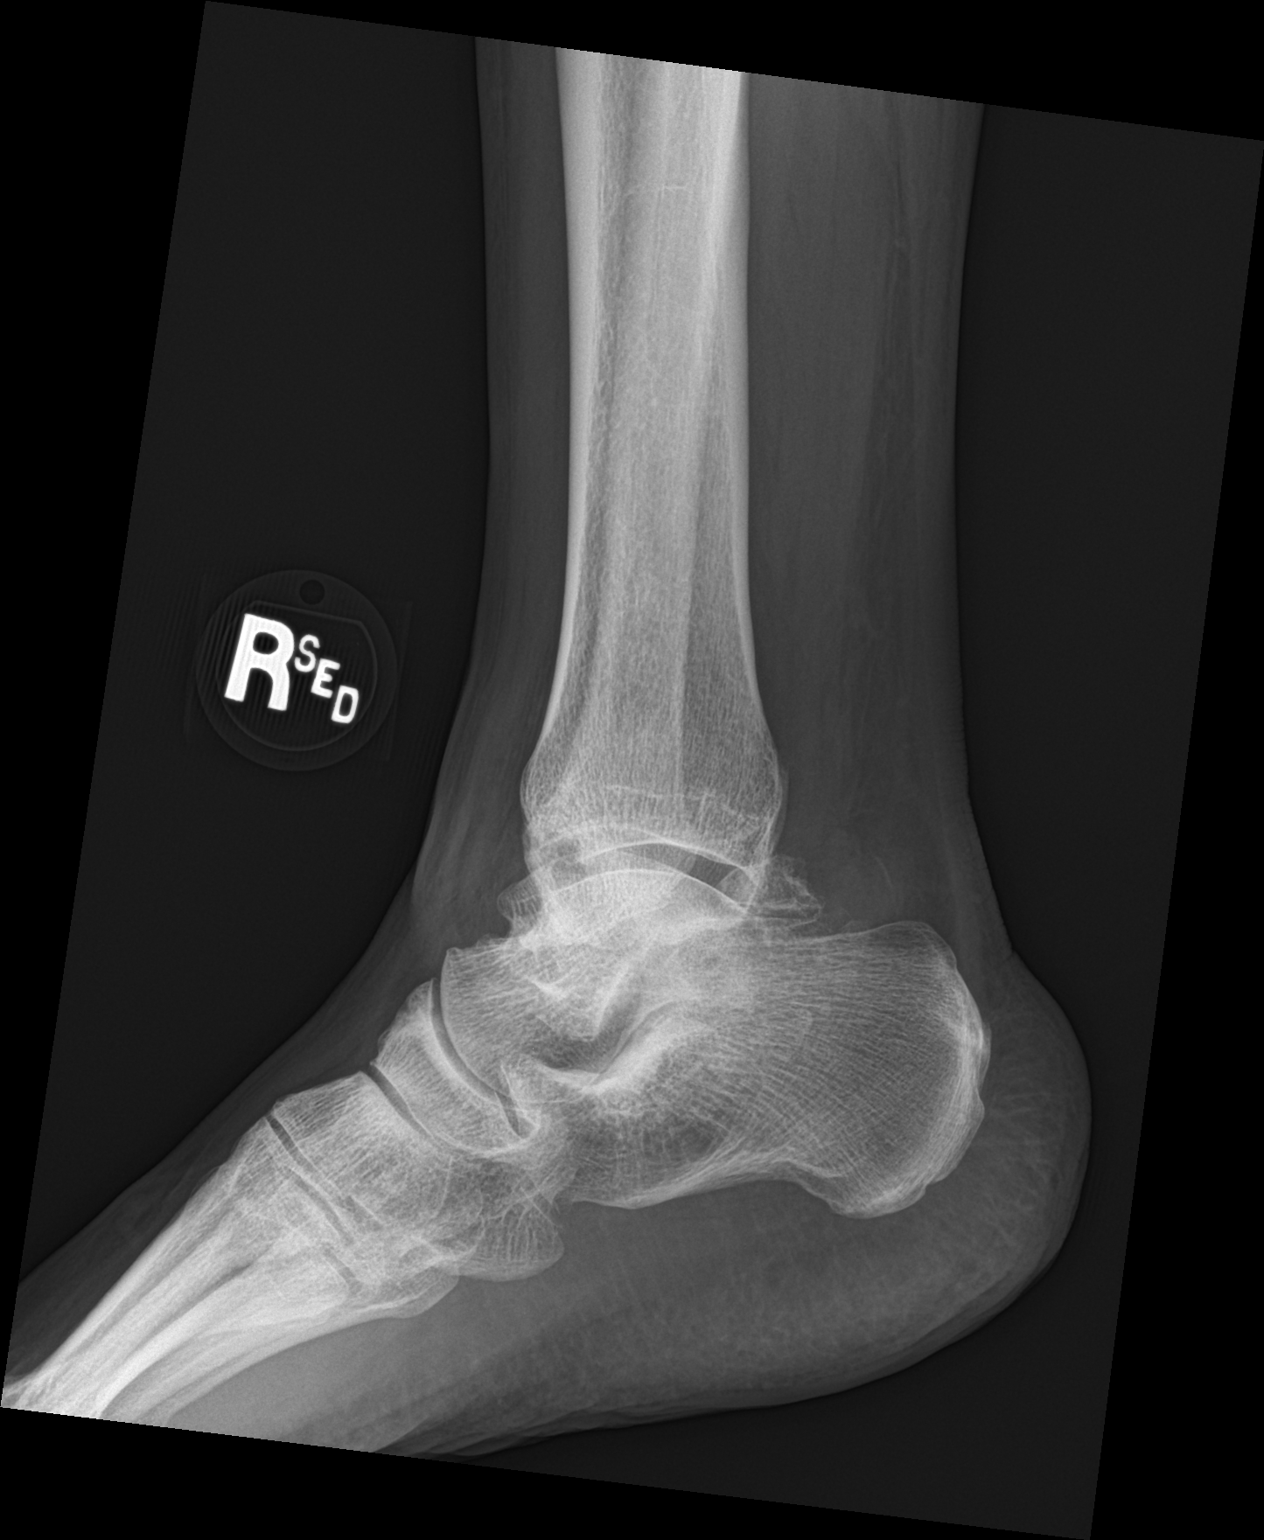

[3 of 3 positions shown; findings below may reference images not displayed]

FINDINGS: No evidence of acute fracture or dislocation. Mild degenerate
changes over the hindfoot region. Ankle mortise is normal.
IMPRESSION: No acute findings.
